# Patient Record
Sex: Male | Born: 1995 | Race: White | Hispanic: No | Marital: Married | Smoking: Current every day smoker
Health system: Southern US, Community
[De-identification: ages and names within clinical notes are randomized; demographics above are authoritative.]

## PROBLEM LIST (undated history)

## (undated) HISTORY — PX: TONSILLECTOMY: SUR1361

## (undated) HISTORY — PX: TUMOR REMOVAL: SHX12

---
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
## 2014-11-03 IMAGING — US US ABDOMEN COMPLETE
1 series · 14 of 25 positions shown · non-contrast
Comparison: None.

CLINICAL DATA: Right upper quadrant abdominal pain

COMPLETE ABDOMINAL ULTRASOUND

[Series 1: us abdomen complete · 0.44mm/px · 14 of 75 slices shown]
[im 1/75]
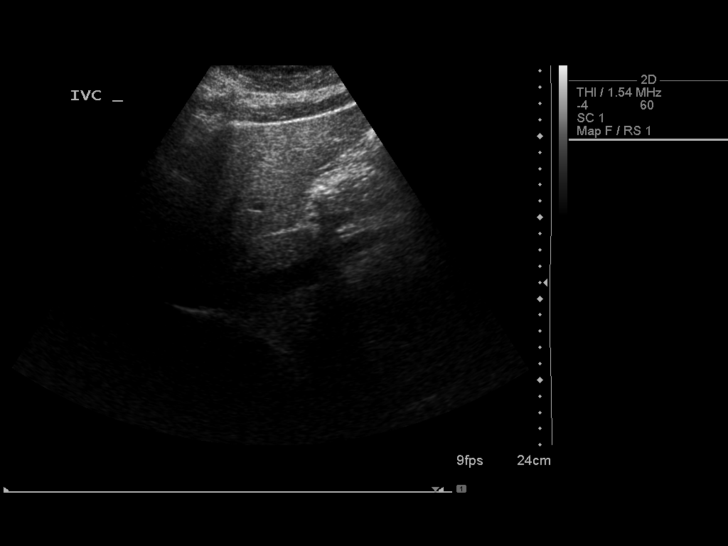
[im 7/75]
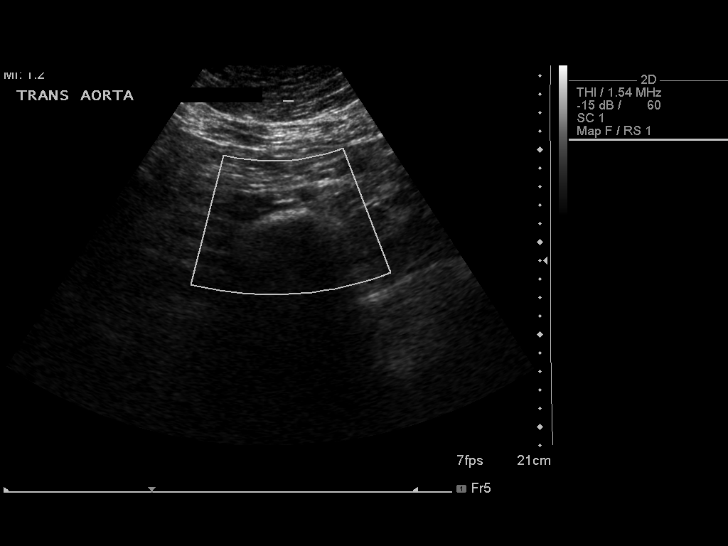
[im 13/75]
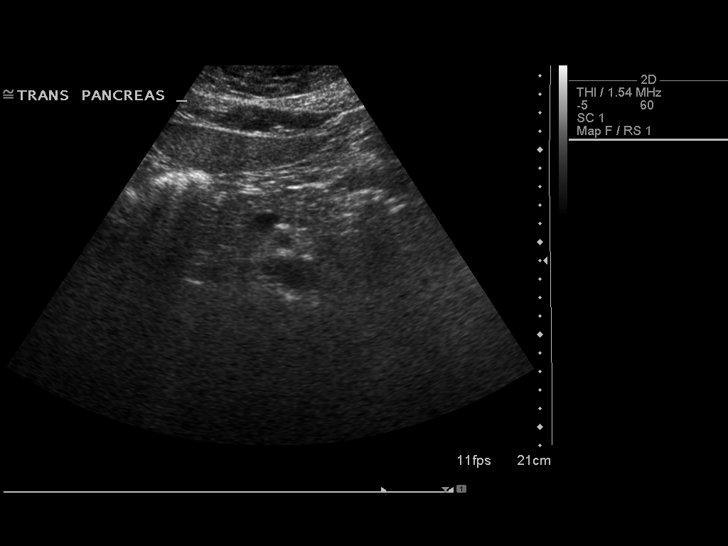
[im 19/75]
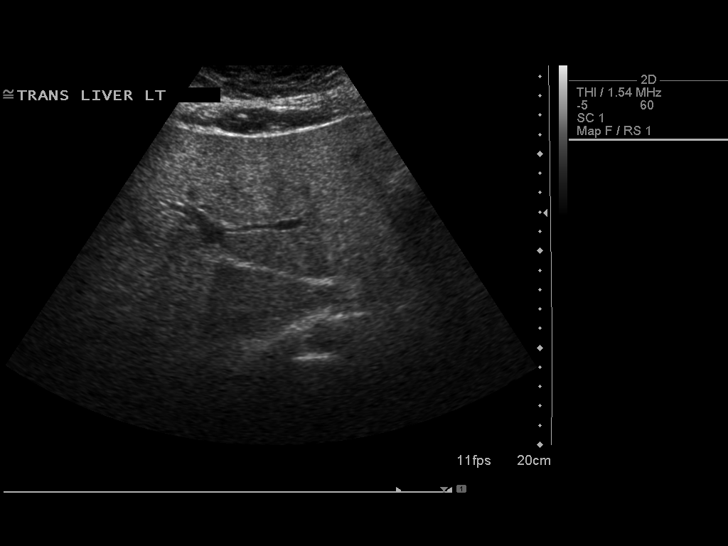
[im 25/75]
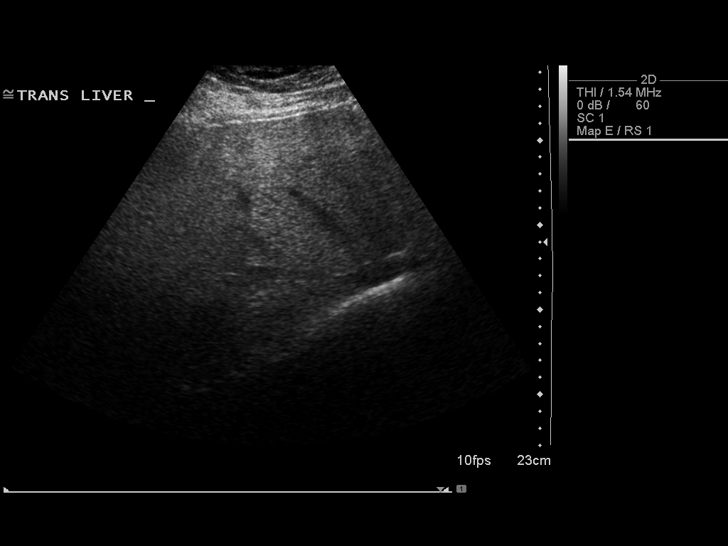
[im 28/75]
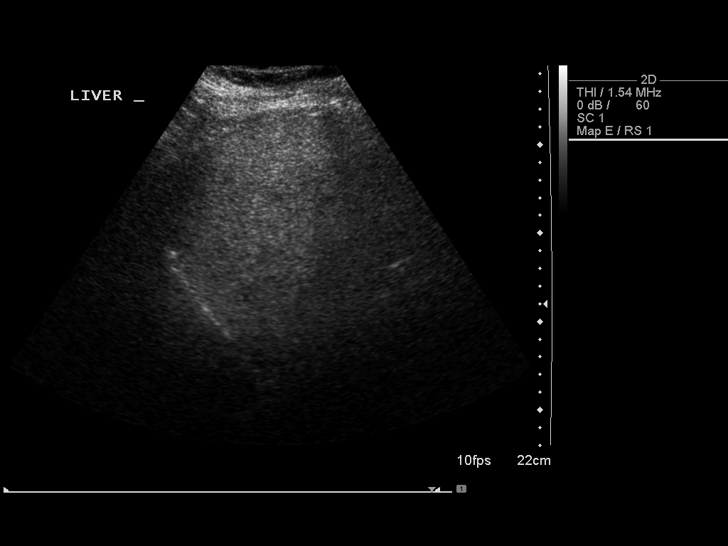
[im 34/75]
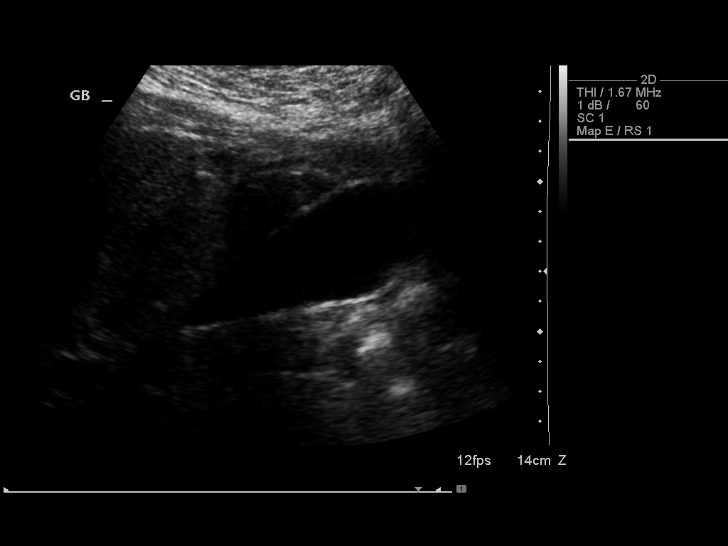
[im 41/75]
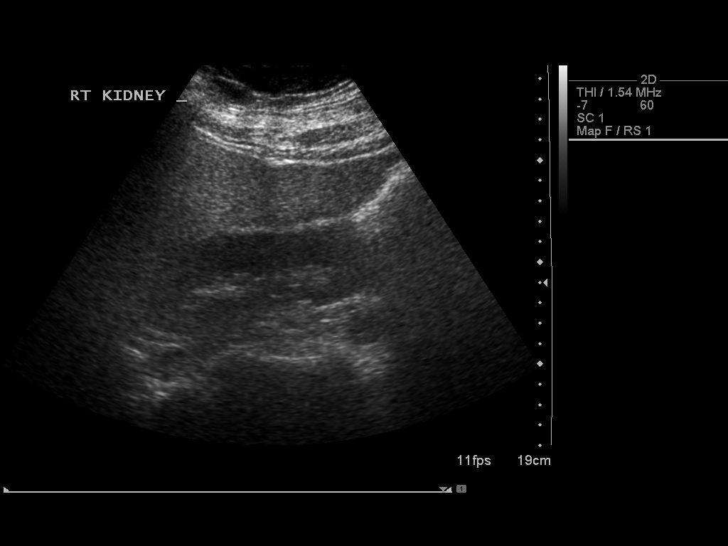
[im 47/75]
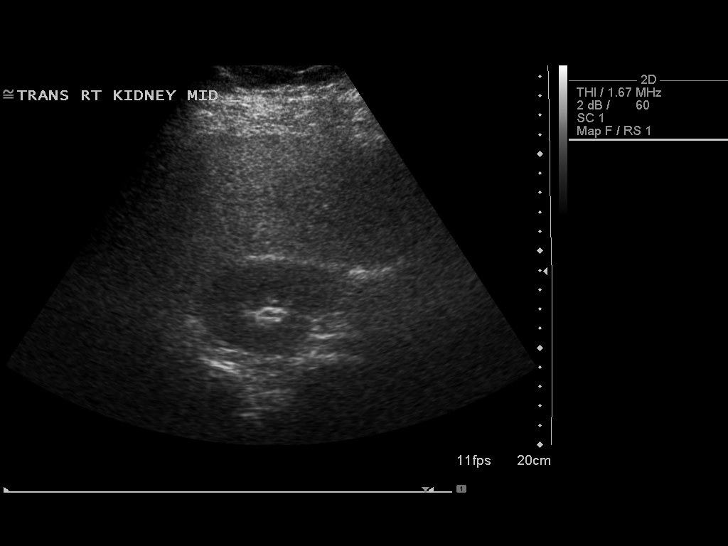
[im 50/75]
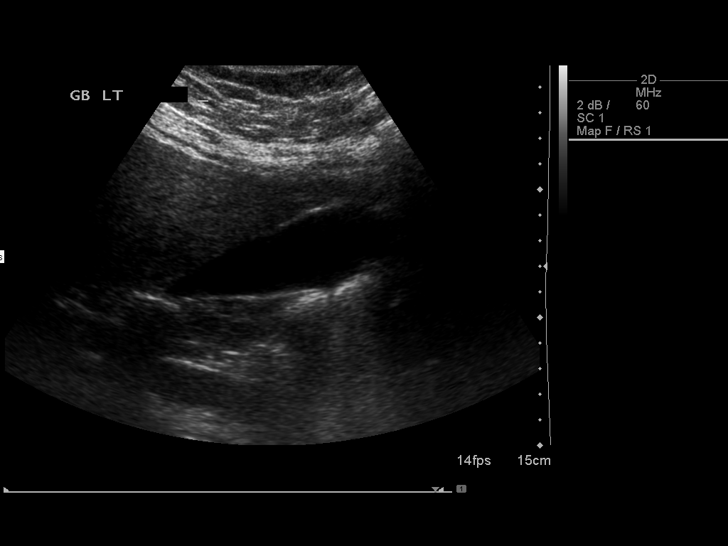
[im 56/75]
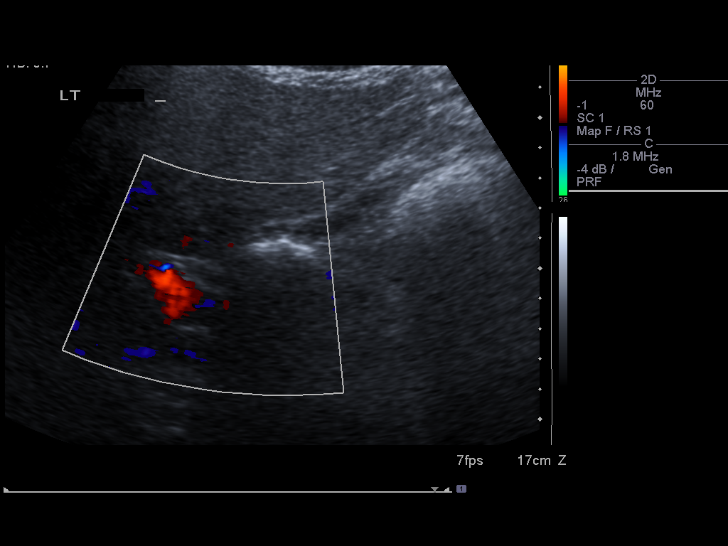
[im 62/75]
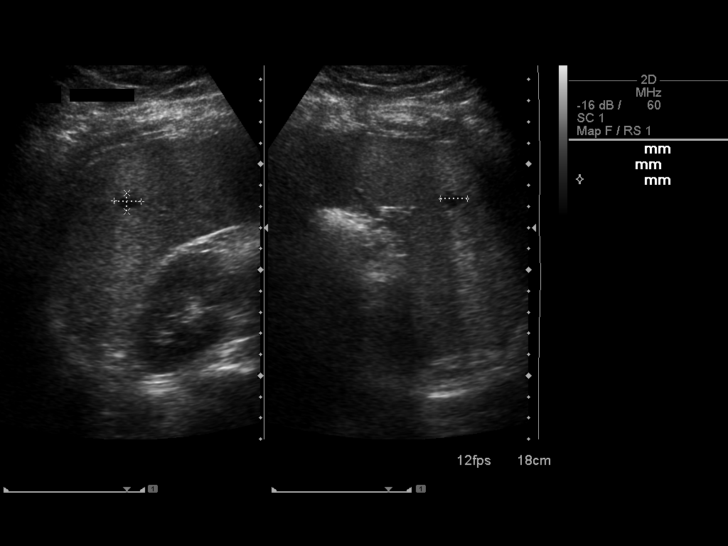
[im 68/75]
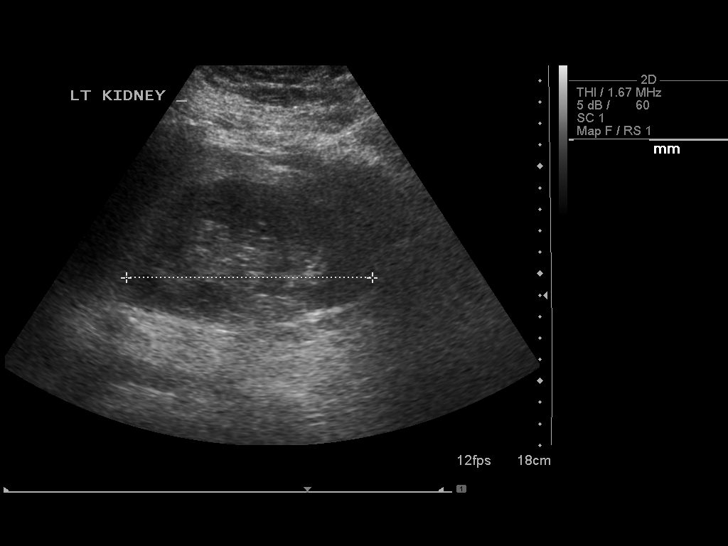
[im 75/75]
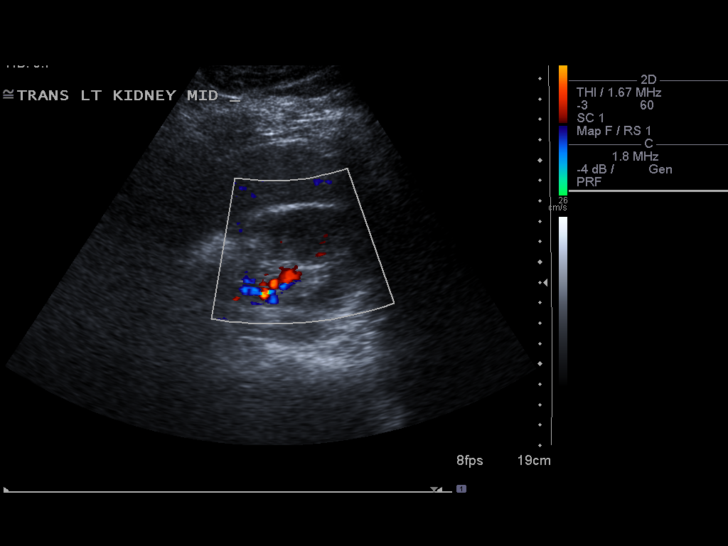

[14 of 25 positions shown; findings below may reference images not displayed]

FINDINGS: Gallbladder:  No gallstones, gallbladder wall thickening, or
pericholecystic fluid.  Negative sonographic Murphy's sign.

Common bile duct:  4 mm.

Liver:  No focal lesion identified.  Hyperechoic hepatic
parenchyma, suggesting hepatic steatosis.

IVC:  Appears normal.

Pancreas:  Poorly visualized due to overlying bowel gas.

Spleen:  Measures 12.0 cm.  13 mm cyst.

Right Kidney:  Measures 11.0 cm.  No mass or hydronephrosis.

Left Kidney:  Measures 11.5 cm.  No mass or hydronephrosis.

Abdominal aorta:  No aneurysm identified.
IMPRESSION: Suspected hepatic steatosis.

Otherwise negative abdominal ultrasound.

## 2014-11-12 ENCOUNTER — Encounter (HOSPITAL_COMMUNITY): Payer: Self-pay | Admitting: Emergency Medicine

## 2014-11-12 ENCOUNTER — Emergency Department (HOSPITAL_COMMUNITY): Payer: No Typology Code available for payment source

## 2014-11-12 ENCOUNTER — Emergency Department (HOSPITAL_COMMUNITY)
Admission: EM | Admit: 2014-11-12 | Discharge: 2014-11-13 | Disposition: A | Discharge status: Home / Self Care | Payer: No Typology Code available for payment source | Attending: Emergency Medicine | Admitting: Emergency Medicine

## 2014-11-12 DIAGNOSIS — Y92410 Unspecified street and highway as the place of occurrence of the external cause: Secondary | ICD-10-CM | POA: Insufficient documentation

## 2014-11-12 DIAGNOSIS — S0990XA Unspecified injury of head, initial encounter: Secondary | ICD-10-CM | POA: Insufficient documentation

## 2014-11-12 DIAGNOSIS — Z720 Tobacco use: Secondary | ICD-10-CM | POA: Insufficient documentation

## 2014-11-12 DIAGNOSIS — S161XXA Strain of muscle, fascia and tendon at neck level, initial encounter: Secondary | ICD-10-CM

## 2014-11-12 DIAGNOSIS — M542 Cervicalgia: Secondary | ICD-10-CM

## 2014-11-12 DIAGNOSIS — Y9389 Activity, other specified: Secondary | ICD-10-CM | POA: Insufficient documentation

## 2014-11-12 DIAGNOSIS — Y998 Other external cause status: Secondary | ICD-10-CM | POA: Diagnosis not present

## 2014-11-12 DIAGNOSIS — S199XXA Unspecified injury of neck, initial encounter: Secondary | ICD-10-CM | POA: Diagnosis present
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
## 2015-06-16 DIAGNOSIS — S46011A Strain of muscle(s) and tendon(s) of the rotator cuff of right shoulder, initial encounter: Secondary | ICD-10-CM | POA: Diagnosis not present

## 2015-06-16 DIAGNOSIS — R0789 Other chest pain: Secondary | ICD-10-CM | POA: Diagnosis not present

## 2015-09-10 DIAGNOSIS — Z0000 Encounter for general adult medical examination without abnormal findings: Secondary | ICD-10-CM | POA: Diagnosis not present

## 2015-09-25 DIAGNOSIS — S39012A Strain of muscle, fascia and tendon of lower back, initial encounter: Secondary | ICD-10-CM | POA: Diagnosis not present

## 2015-12-30 DIAGNOSIS — Z1389 Encounter for screening for other disorder: Secondary | ICD-10-CM | POA: Diagnosis not present

## 2015-12-30 DIAGNOSIS — L259 Unspecified contact dermatitis, unspecified cause: Secondary | ICD-10-CM | POA: Diagnosis not present

## 2016-03-15 DIAGNOSIS — K529 Noninfective gastroenteritis and colitis, unspecified: Secondary | ICD-10-CM | POA: Diagnosis not present

## 2016-03-15 DIAGNOSIS — J209 Acute bronchitis, unspecified: Secondary | ICD-10-CM | POA: Diagnosis not present

## 2016-06-09 DIAGNOSIS — L308 Other specified dermatitis: Secondary | ICD-10-CM | POA: Diagnosis not present

## 2016-11-21 DIAGNOSIS — Z0000 Encounter for general adult medical examination without abnormal findings: Secondary | ICD-10-CM | POA: Diagnosis not present

## 2016-11-21 DIAGNOSIS — M79672 Pain in left foot: Secondary | ICD-10-CM | POA: Diagnosis not present
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
